# Patient Record
Sex: Female | Born: 2000 | Race: Black or African American | Hispanic: No | Marital: Single | Smoking: Never smoker
Health system: Southern US, Community
[De-identification: ages and names within clinical notes are randomized; demographics above are authoritative.]

## PROBLEM LIST (undated history)

## (undated) DIAGNOSIS — J45909 Unspecified asthma, uncomplicated: Secondary | ICD-10-CM

---
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
## 2021-06-02 DIAGNOSIS — E538 Deficiency of other specified B group vitamins: Secondary | ICD-10-CM | POA: Insufficient documentation

## 2021-08-21 ENCOUNTER — Other Ambulatory Visit: Payer: Self-pay

## 2021-08-21 ENCOUNTER — Ambulatory Visit
Admission: RE | Admit: 2021-08-21 | Discharge: 2021-08-21 | Disposition: A | Discharge status: Home / Self Care | Payer: Medicaid Other | Source: Ambulatory Visit | Attending: Emergency Medicine | Admitting: Emergency Medicine

## 2021-08-21 VITALS — BP 119/83 | HR 87 | Temp 98.4°F | Resp 16 | Ht 68.0 in | Wt 165.0 lb

## 2021-08-21 DIAGNOSIS — Z202 Contact with and (suspected) exposure to infections with a predominantly sexual mode of transmission: Secondary | ICD-10-CM | POA: Diagnosis not present

## 2021-08-21 MED ORDER — DOXYCYCLINE HYCLATE 100 MG PO CAPS: 100.0000 mg | ORAL_CAPSULE | Freq: Two times a day (BID) | ORAL | 0 refills | Status: AC

## 2021-08-21 NOTE — ED Triage Notes (Signed)
Pt's boyfriend went to doctor yesterday and tested positive for chlamydia.   Pt was last tested in March and was negative. Pt's boyfriend hasn't been tested in years so they're not sure how long he's been positive.   Pt is having no symptoms.

## 2021-08-21 NOTE — ED Provider Notes (Signed)
[facility]   MRN: [id number] DOB: [birth year]  Subjective:   Chief Complaint;  Chief Complaint  Patient presents with   Exposure to STD    Entered by patient  Pt's boyfriend went to doctor yesterday and tested positive for chlamydia.   Pt was last tested in March and was negative. Pt's boyfriend hasn't been tested in years so they're not sure how long he's been positive.   Pt is having no symptoms  Leslie Fuentes is a 21 y.o. female presenting for 2 days ago.  She states her boyfriend had a positive diagnosis of chlamydia yesterday patient denies symptoms  No current facility-administered medications for this encounter.  Current Outpatient Medications:    doxycycline (VIBRAMYCIN) 100 MG capsule, Take 1 capsule (100 mg total) by mouth 2 (two) times daily for 7 days., Disp: 14 capsule, Rfl: 0   ALBUTEROL IN, Inhale into the lungs as needed., Disp: , Rfl:    aspirin-acetaminophen-caffeine (EXCEDRIN MIGRAINE) 250-250-65 MG tablet, Take 1 tablet by mouth every 6 (six) hours as needed for headache., Disp: , Rfl:    metroNIDAZOLE (FLAGYL) 500 MG tablet, Take 1 tablet (500 mg total) by mouth 2 (two) times daily., Disp: 14 tablet, Rfl: 0   ondansetron (ZOFRAN ODT) 4 MG disintegrating tablet, Take 1 tablet (4 mg total) by mouth every 8 (eight) hours as needed., Disp: 20 tablet, Rfl: 0   No Known Allergies  Past Medical History:  Diagnosis Date   Asthma      Review of Systems  All other systems reviewed and are negative.    Objective:   Vitals: BP 119/83 (BP Location: Left Arm)   Pulse 87   Temp 98.4 F (36.9 C) (Oral)   Resp 16   Ht 5\' 8"  (1.727 m)   Wt 165 lb (74.8 kg)   LMP 08/07/2021 (Approximate)   SpO2 99%   Breastfeeding Unknown   BMI 25.09 kg/m   Physical Exam Vitals and nursing note reviewed.  Constitutional:      General: She is not in acute distress.    Appearance: She is well-developed.  HENT:     Head: Normocephalic and atraumatic.   Eyes:     Conjunctiva/sclera: Conjunctivae normal.  Cardiovascular:     Rate and Rhythm: Normal rate and regular rhythm.  Pulmonary:     Effort: Pulmonary effort is normal. No respiratory distress.     Breath sounds: Normal breath sounds.  Musculoskeletal:     Cervical back: Neck supple.  Skin:    General: Skin is warm and dry.     Capillary Refill: Capillary refill takes less than 2 seconds.  Neurological:     Mental Status: She is alert.  Psychiatric:        Mood and Affect: Mood normal.     No results found for this or any previous visit (from the past 24 hour(s)).  No results found.     Assessment and Plan :   1. Exposure to chlamydia     Meds ordered this encounter  Medications   doxycycline (VIBRAMYCIN) 100 MG capsule    Sig: Take 1 capsule (100 mg total) by mouth 2 (two) times daily for 7 days.    Dispense:  14 capsule    Refill:  0    MDM:  Leslie Fuentes is a 20 y.o. female presenting for evaluation for chlamydia.  Patient was exposed to her boyfriend who tested positive yesterday for chlamydia.  Her last unprotected  sex was 2 days ago she is asymptomatic.  Urine was sent for chlamydia gonorrhea and trichomoniasis.  Patient will be notified for the results she will return for new or worsening symptoms.  Doxycycline was prescribed which patient could take started or wait until results returned.  I discussed treatment, follow up and return instructions. Questions were answered. Patient/representative stated understanding of instructions and patient is stable for discharge.  Leslie Conger FNP-C MCN    Leslie Baseman, NP 08/21/21 [phone]

## 2021-08-21 NOTE — Discharge Instructions (Addendum)
Take doxycycline now or wait until your results return.  Abstain from intercourse until your tests are known protective sex will help prevention from noncurable diseases such as herpes, warts and HIV

## 2021-08-23 LAB — CERVICOVAGINAL ANCILLARY ONLY
Bacterial Vaginitis (gardnerella): POSITIVE — AB
Candida Glabrata: NEGATIVE
Candida Vaginitis: NEGATIVE
Chlamydia: POSITIVE — AB
Comment: NEGATIVE
Comment: NEGATIVE
Comment: NEGATIVE
Comment: NEGATIVE
Comment: NEGATIVE
Comment: NORMAL
Neisseria Gonorrhea: NEGATIVE
Trichomonas: NEGATIVE

## 2021-08-24 ENCOUNTER — Telehealth (HOSPITAL_COMMUNITY): Payer: Self-pay | Admitting: Emergency Medicine

## 2021-08-24 MED ORDER — METRONIDAZOLE 0.75 % VA GEL: 1.0000 | Freq: Every day | VAGINAL | 0 refills | Status: AC

## 2022-04-26 IMAGING — US US OB < 14 WEEKS - US OB TV
1 series · 13 of 28 positions shown · non-contrast
Comparison: None.

CLINICAL DATA: Vaginal bleeding for 1 day. Quantitative beta HCG is
[DATE]. Estimated gestational age by LMP is 7 weeks 3 days.

EXAM:
OBSTETRIC <14 WK US AND TRANSVAGINAL OB US
TECHNIQUE: Both transabdominal and transvaginal ultrasound examinations were
performed for complete evaluation of the gestation as well as the
maternal uterus, adnexal regions, and pelvic cul-de-sac.
Transvaginal technique was performed to assess early pregnancy.

[Series 1: us ob less than 14 weeks with ob transvaginal · 13 of 99 slices shown]
[im 4/99]
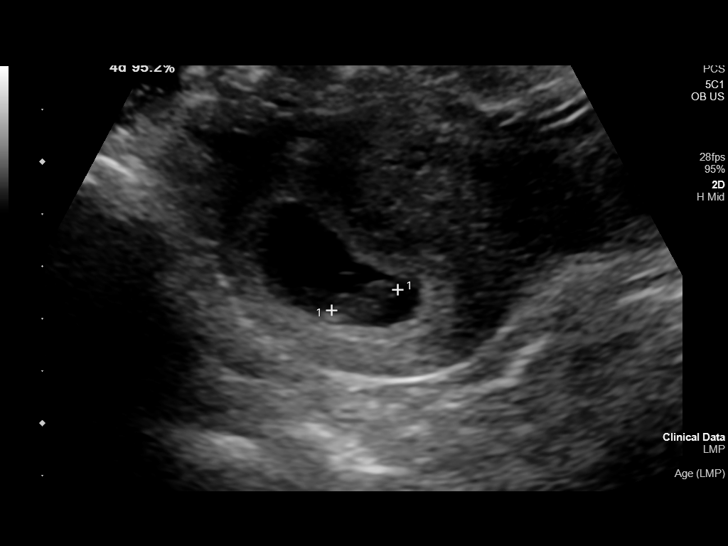
[im 11/99]
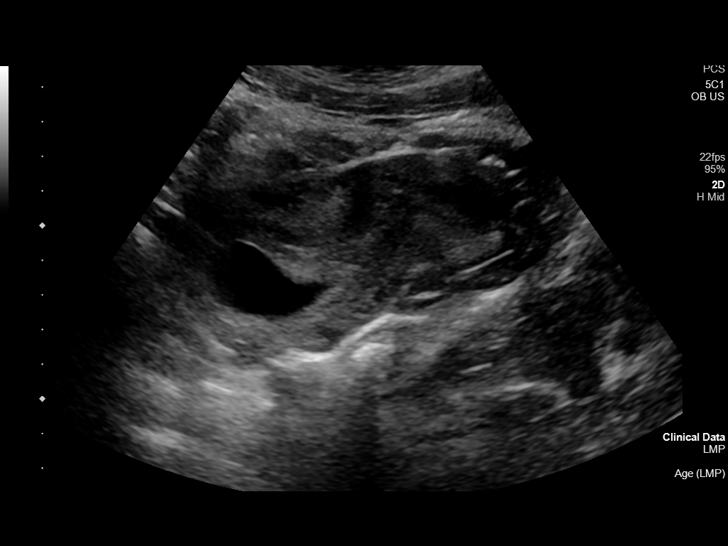
[im 19/99]
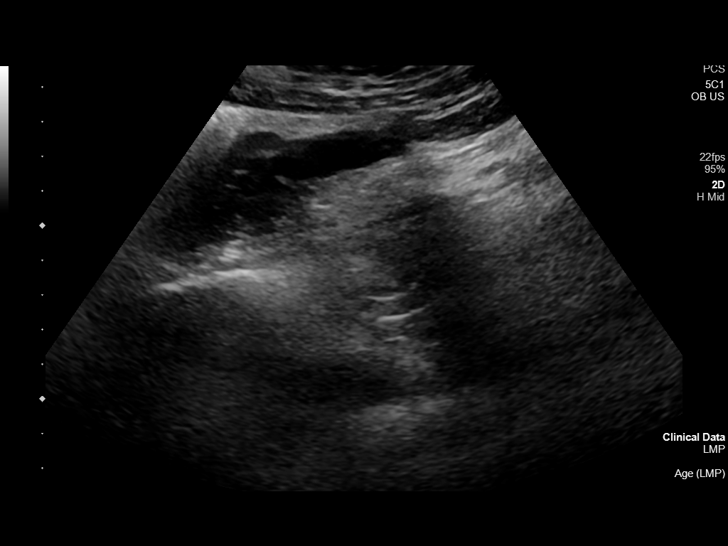
[im 26/99]
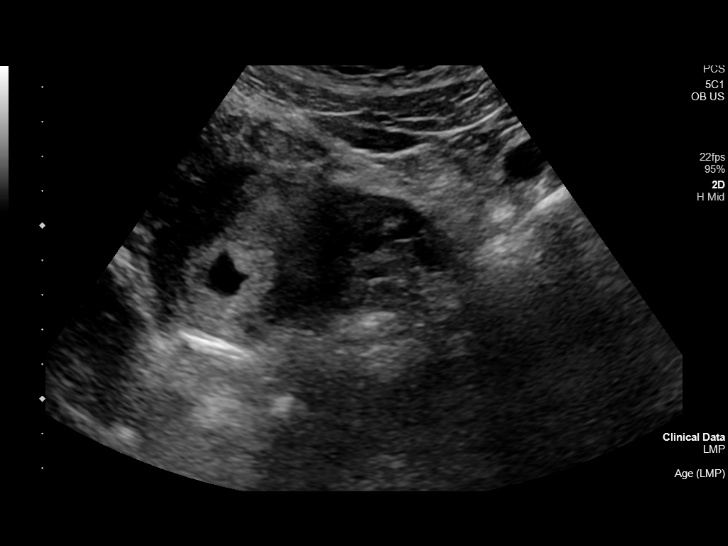
[im 33/99]
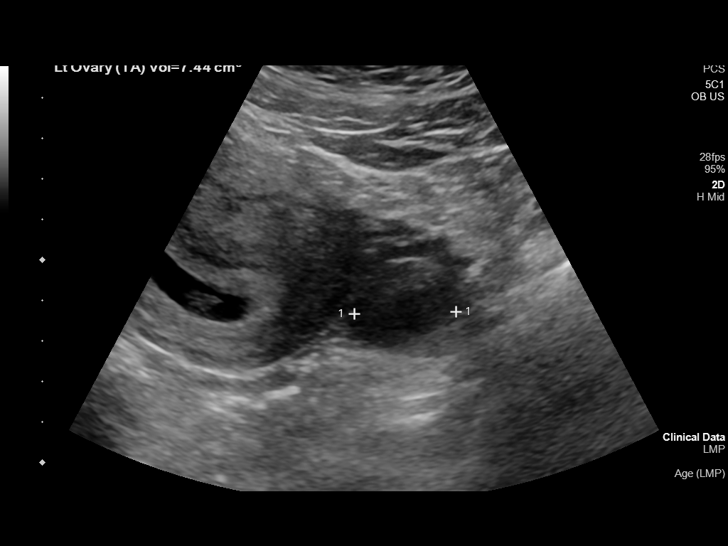
[im 40/99]
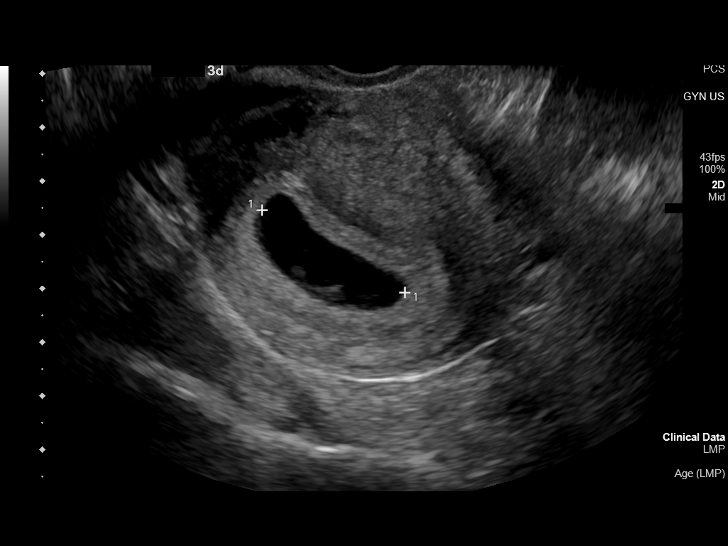
[im 51/99]
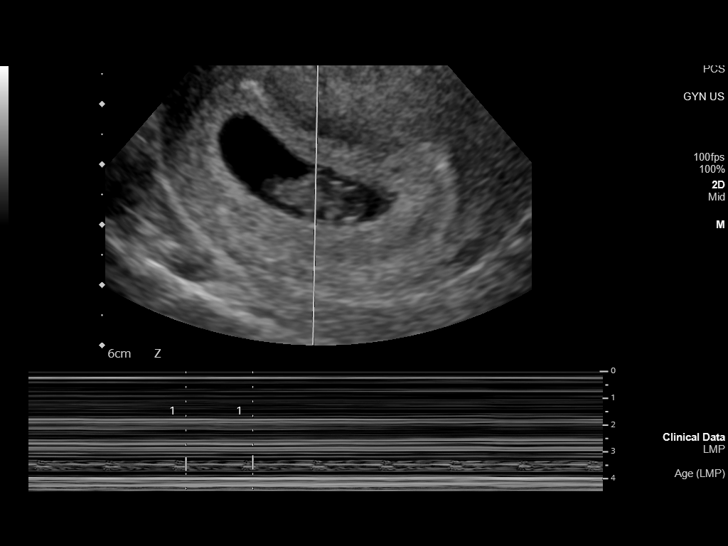
[im 59/99]
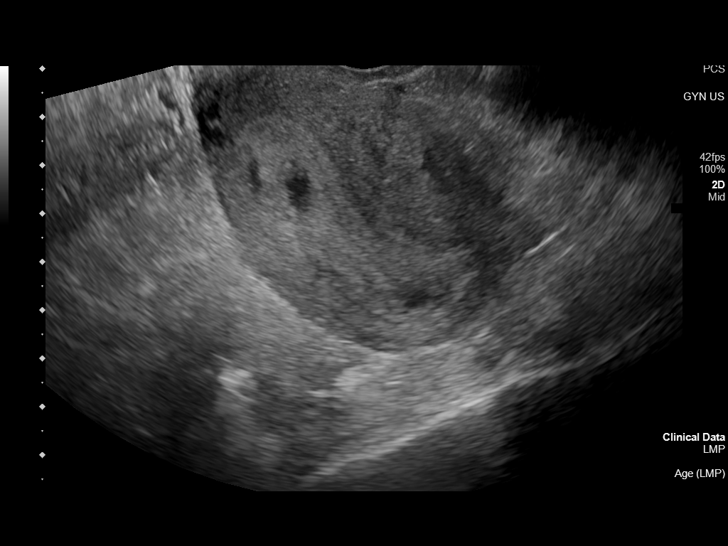
[im 66/99]
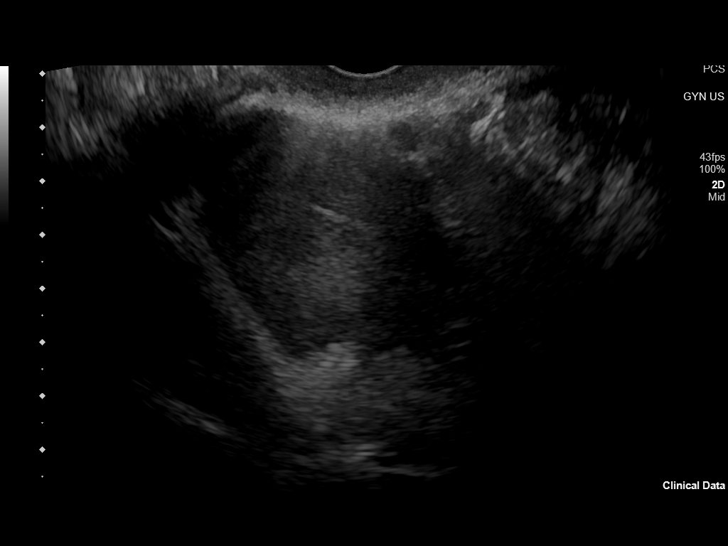
[im 73/99]
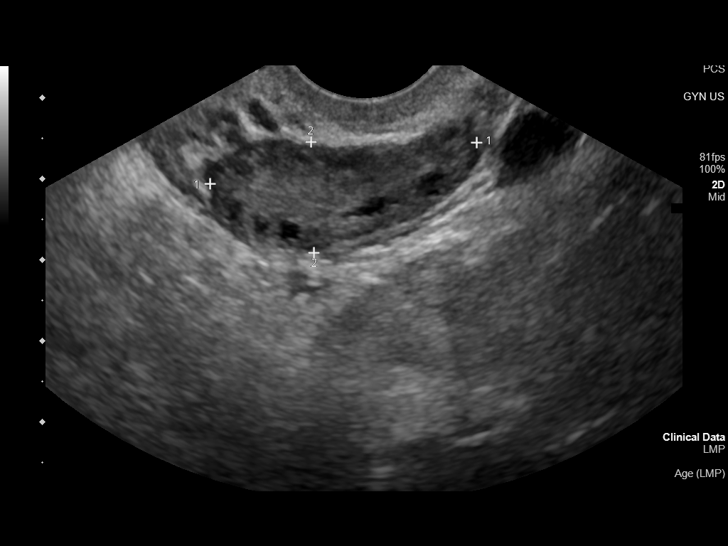
[im 80/99]
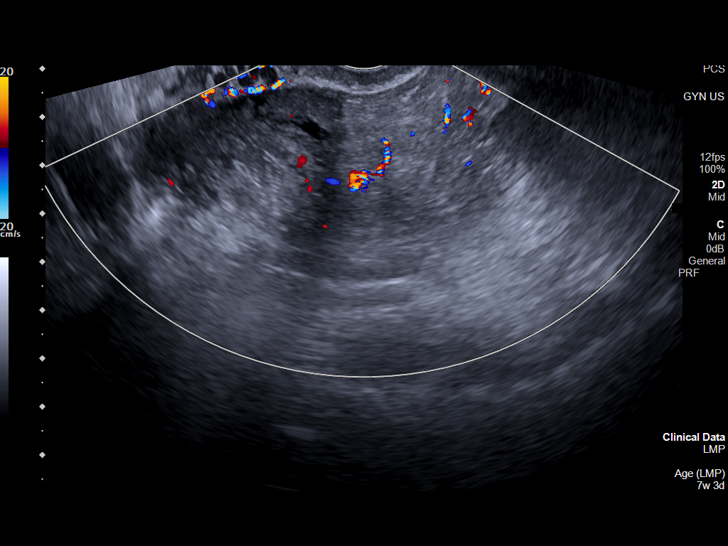
[im 88/99]
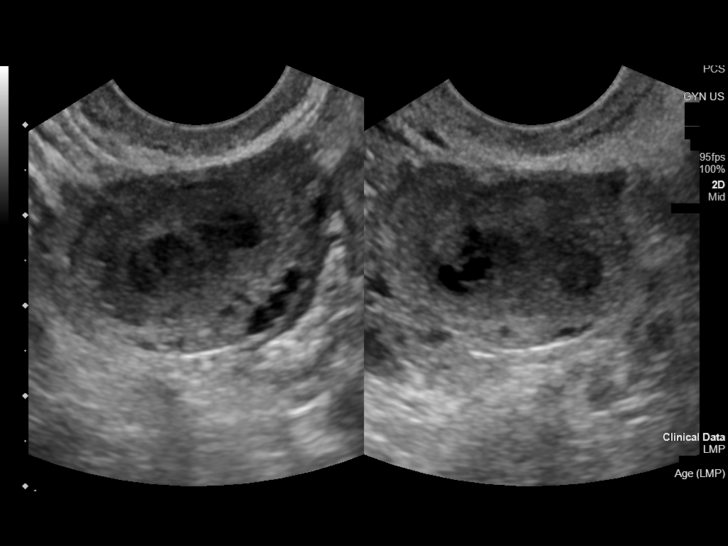
[im 95/99]
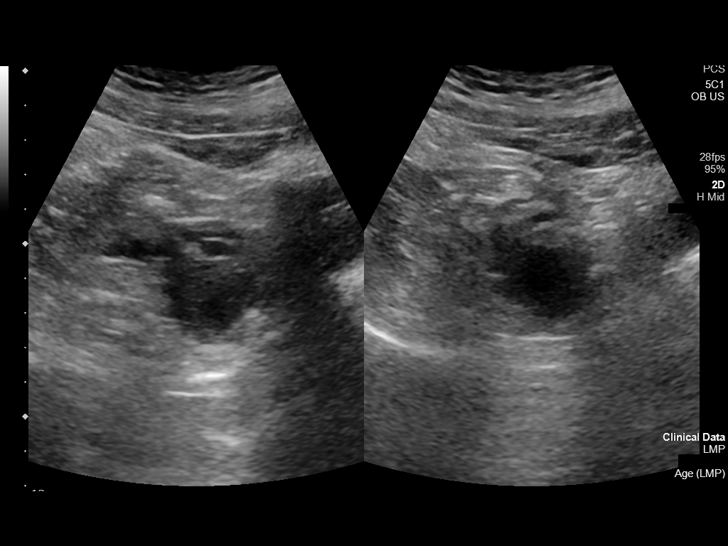

[13 of 28 positions shown; findings below may reference images not displayed]

FINDINGS: Intrauterine gestational sac: A single intrauterine gestational sac
is present.

Yolk sac:  Yolk sac is identified.

Embryo:  Fetal pole is present.

Cardiac Activity: Fetal cardiac activity is observed.

Heart Rate: 153 bpm

CRL:  14 mm   7 w   5 d                  US EDC: 08/04/2020

Subchorionic hemorrhage:  None visualized.

Maternal uterus/adnexae: The uterus appears retroverted. No
myometrial mass lesions identified. Both ovaries are visualized and
appear normal. Probable corpus luteal cyst on the left. Small amount
of free fluid in the pelvis.
IMPRESSION: Single intrauterine pregnancy. Estimated gestational age by LMP is 7
weeks 5 days. No acute complication is identified sonographically.

## 2022-07-05 DIAGNOSIS — I471 Supraventricular tachycardia, unspecified: Secondary | ICD-10-CM | POA: Insufficient documentation

## 2022-11-07 DIAGNOSIS — F4312 Post-traumatic stress disorder, chronic: Secondary | ICD-10-CM | POA: Insufficient documentation

## 2022-11-07 DIAGNOSIS — F4323 Adjustment disorder with mixed anxiety and depressed mood: Secondary | ICD-10-CM | POA: Insufficient documentation

## 2022-11-13 ENCOUNTER — Encounter: Payer: Self-pay | Admitting: Emergency Medicine

## 2022-11-13 ENCOUNTER — Ambulatory Visit
Admission: EM | Admit: 2022-11-13 | Discharge: 2022-11-13 | Disposition: A | Discharge status: Home / Self Care | Payer: Medicaid Other | Attending: Physician Assistant | Admitting: Physician Assistant

## 2022-11-13 DIAGNOSIS — R509 Fever, unspecified: Secondary | ICD-10-CM | POA: Diagnosis present

## 2022-11-13 DIAGNOSIS — R590 Localized enlarged lymph nodes: Secondary | ICD-10-CM | POA: Insufficient documentation

## 2022-11-13 DIAGNOSIS — Z1152 Encounter for screening for COVID-19: Secondary | ICD-10-CM | POA: Insufficient documentation

## 2022-11-13 LAB — SARS CORONAVIRUS 2 BY RT PCR: SARS Coronavirus 2 by RT PCR: NEGATIVE

## 2022-11-13 LAB — POCT RAPID STREP A (OFFICE): Rapid Strep A Screen: NEGATIVE

## 2022-11-13 NOTE — ED Provider Notes (Signed)
[name]    CSN: [id number] Arrival date & time: 11/13/22  1014      History   Chief Complaint Chief Complaint  Patient presents with   Lymphadenopathy   Chills   Headache    HPI Leslie Fuentes is a 22 y.o. female.   Patient reports subjective fever at home with bodyaches and chills.  She has been taking Tylenol with reduction of fever.  She had Tylenol before she came here today.  She also complains of swollen lymph node to left side of her neck.  Area is nontender.  She denies sore throat, congestion, cough.  She denies sick contacts.  Patient missed work today due to symptoms.    Past Medical History:  Diagnosis Date   Asthma     There are no problems to display for this patient.   Past Surgical History:  Procedure Laterality Date   TONSILLECTOMY AND ADENOIDECTOMY N/A 09/21/2017   Procedure: TONSILLECTOMY AND ADENOIDECTOMY;  Surgeon: Vernie Murders, MD;  Location: [facility];  Service: ENT;  Laterality: N/A;    OB History     Gravida  1   Para      Term      Preterm      AB      Living         SAB      IAB      Ectopic      Multiple      Live Births               Home Medications    Prior to Admission medications   Medication Sig Start Date End Date Taking? Authorizing Provider  FLUoxetine (PROZAC) 10 MG capsule Take 10 mg by mouth daily. 11/07/22 11/07/23 Yes [provider]  medroxyPROGESTERone (DEPO-PROVERA) 150 MG/ML injection Inject 150 mg into the muscle every 3 (three) months. 04/05/22  Yes [provider]  prazosin (MINIPRESS) 1 MG capsule Take 1 mg by mouth at bedtime. 11/07/22 11/07/23 Yes [provider]  ALBUTEROL IN Inhale into the lungs as needed.    [provider]  aspirin-acetaminophen-caffeine (EXCEDRIN MIGRAINE) [phone] MG tablet Take 1 tablet by mouth every 6 (six) hours as needed for headache.    [provider]  ondansetron (ZOFRAN ODT) 4 MG  disintegrating tablet Take 1 tablet (4 mg total) by mouth every 8 (eight) hours as needed. 12/21/20   Nita Sickle, MD    Family History Family History  Problem Relation Age of Onset   Healthy Mother    Healthy Father     Social History Social History   Tobacco Use   Smoking status: Never   Smokeless tobacco: Never  Vaping Use   Vaping status: Never Used  Substance Use Topics   Alcohol use: Not Currently   Drug use: Not Currently     Allergies   Patient has no known allergies.   Review of Systems Review of Systems  Constitutional:  Positive for chills and fever.  HENT:  Negative for ear pain and sore throat.   Eyes:  Negative for pain and visual disturbance.  Respiratory:  Negative for cough and shortness of breath.   Cardiovascular:  Negative for chest pain and palpitations.  Gastrointestinal:  Negative for abdominal pain and vomiting.  Genitourinary:  Negative for dysuria and hematuria.  Musculoskeletal:  Negative for arthralgias and back pain.  Skin:  Negative for color change and rash.  Neurological:  Negative for seizures and syncope.  All other systems reviewed and are negative.    Physical Exam Triage Vital Signs ED Triage Vitals [11/13/22 1033]  Encounter Vitals Group     BP (!) 134/93     Systolic BP Percentile      Diastolic BP Percentile      Pulse Rate (!) 101     Resp 17     Temp 98.5 F (36.9 C)     Temp Source Oral     SpO2 97 %     Weight      Height      Head Circumference      Peak Flow      Pain Score 0     Pain Loc      Pain Education      Exclude from Growth Chart    No data found.  Updated Vital Signs BP (!) 134/93 (BP Location: Right Arm)   Pulse (!) 101   Temp 98.5 F (36.9 C) (Oral)   Resp 17   SpO2 97%   Visual Acuity Right Eye Distance:   Left Eye Distance:   Bilateral Distance:    Right Eye Near:   Left Eye Near:    Bilateral Near:     Physical Exam Vitals and nursing note reviewed.   Constitutional:      General: She is not in acute distress.    Appearance: She is well-developed.  HENT:     Head: Normocephalic and atraumatic.  Eyes:     Conjunctiva/sclera: Conjunctivae normal.  Cardiovascular:     Rate and Rhythm: Normal rate and regular rhythm.     Heart sounds: No murmur heard. Pulmonary:     Effort: Pulmonary effort is normal. No respiratory distress.     Breath sounds: Normal breath sounds.  Abdominal:     Palpations: Abdomen is soft.     Tenderness: There is no abdominal tenderness.  Musculoskeletal:        General: No swelling.     Cervical back: Neck supple.  Lymphadenopathy:     Cervical: Cervical adenopathy present.  Skin:    General: Skin is warm and dry.     Capillary Refill: Capillary refill takes less than 2 seconds.  Neurological:     Mental Status: She is alert.  Psychiatric:        Mood and Affect: Mood normal.      UC Treatments / Results  Labs (all labs ordered are listed, but only abnormal results are displayed) Labs Reviewed  SARS CORONAVIRUS 2 BY RT PCR  POCT RAPID STREP A (OFFICE)    EKG   Radiology No results found.  Procedures Procedures (including critical care time)  Medications Ordered in UC Medications - No data to display  Initial Impression / Assessment and Plan / UC Course  I have reviewed the triage vital signs and the nursing notes.  Pertinent labs & imaging results that were available during my care of the patient were reviewed by me and considered in my medical decision making (see chart for details).     Patient overall well-appearing in no acute distress.  Rapid strep negative in clinic today, HEENT exam normal.  Left cervical adenopathy, no TTP.  Work note given today.  Supportive care discussed.  Return precautions discussed.  COVID test pending. Final Clinical Impressions(s) / UC Diagnoses   Final diagnoses:  Fever, unspecified     Discharge Instructions      Continue with Tylenol or  Ibuprofen as needed for fever  Rest, drink plenty of fluids May return to work or school as long as fever free for 24 hours   ED Prescriptions   None    PDMP not reviewed this encounter.   Ward, Tylene Fantasia, PA-C 11/13/22 1132

## 2022-11-13 NOTE — Discharge Instructions (Signed)
Continue with Tylenol or Ibuprofen as needed for fever Rest, drink plenty of fluids May return to work or school as long as fever free for 24 hours

## 2022-11-13 NOTE — ED Triage Notes (Signed)
Pt reports lymph node swelling and pain on the left side of neck, chills and sweating. Unknown if had a fever. Has been taking Tylenol.

## 2023-05-27 ENCOUNTER — Ambulatory Visit: Admission: EM | Admit: 2023-05-27 | Discharge: 2023-05-27 | Disposition: A | Discharge status: Home / Self Care

## 2023-05-27 ENCOUNTER — Ambulatory Visit
Admission: EM | Admit: 2023-05-27 | Discharge: 2023-05-27 | Disposition: A | Discharge status: Home / Self Care | Attending: Emergency Medicine | Admitting: Emergency Medicine

## 2023-05-27 DIAGNOSIS — S61452A Open bite of left hand, initial encounter: Secondary | ICD-10-CM | POA: Diagnosis not present

## 2023-05-27 DIAGNOSIS — W540XXA Bitten by dog, initial encounter: Secondary | ICD-10-CM | POA: Diagnosis not present

## 2023-05-27 DIAGNOSIS — S61432A Puncture wound without foreign body of left hand, initial encounter: Secondary | ICD-10-CM

## 2023-05-27 DIAGNOSIS — L709 Acne, unspecified: Secondary | ICD-10-CM | POA: Insufficient documentation

## 2023-05-27 MED ORDER — AMOXICILLIN-POT CLAVULANATE 875-125 MG PO TABS: 1.0000 | ORAL_TABLET | Freq: Two times a day (BID) | ORAL | 0 refills | Status: AC

## 2023-05-27 NOTE — ED Triage Notes (Signed)
 Patient presents to UC for dog bite to left hand yesterday. States she cleaned site with iodine and wrapped with guaze. Up to date on Tdap.

## 2023-05-27 NOTE — Discharge Instructions (Addendum)
 Today you are evaluated for dog bite  As the dog's your pet will not need rabies series  As you are up-to-date on your vaccines you will not need tetanus injection today  Wound has been cleansed here in the office, cleanse once daily with unscented soap and water, pat dry, may leave open to air but please cover with a nonstick Band-Aid if at risk for becoming dirty  Dog mouths are considered to be dirty and therefore you have been started on antibiotics, begin Augmentin every morning and every evening for 7 days  May take Tylenol and/or Motrin as needed for pain  Please follow-up for any concerns regarding healing

## 2023-05-27 NOTE — ED Notes (Signed)
 Attempted to call patient x 1, phone number is not working.  Patient Access knew where patient was parked, patient is no longer located in the parking.  Will d/c.

## 2023-05-28 NOTE — ED Provider Notes (Signed)
 [name]    CSN: [id number] Arrival date & time: 05/27/23  1508      History   Chief Complaint Chief Complaint  Patient presents with   Animal Bite    HPI Leslie Fuentes is a 23 y.o. female.   Patient presents for evaluation of a dog bite to the left hand beginning 1 day ago.  Offending animal is her pet who is up-to-date on vaccines.  Per patient up-to-date on tetanus.  Has cleans but has had pain to the site all day.  Able to complete range of motion but painful to lift objects.  Denies numbness or tingling.  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 11/07/2022   Situational mixed anxiety and depressive disorder 11/07/2022   PSVT (paroxysmal supraventricular tachycardia) (HCC) 07/05/2022   Vitamin B12 deficiency 06/02/2021   Menorrhagia with regular cycle 02/12/2018   Allergic rhinitis 08/15/2017   Eczema 01/28/2014   Mild intermittent asthma 01/28/2014    Past Surgical History:  Procedure Laterality Date   TONSILLECTOMY AND ADENOIDECTOMY N/A 09/21/2017   Procedure: TONSILLECTOMY AND ADENOIDECTOMY;  Surgeon: Vernie Murders, MD;  Location: [facility];  Service: ENT;  Laterality: N/A;    OB History     Gravida  1   Para      Term      Preterm      AB      Living         SAB      IAB      Ectopic      Multiple      Live Births               Home Medications    Prior to Admission medications   Medication Sig Start Date End Date Taking? Authorizing Provider  amoxicillin-clavulanate (AUGMENTIN) 875-125 MG tablet Take 1 tablet by mouth every 12 (twelve) hours. 05/27/23  Yes Munachimso Palin, Elita Boone, NP  ALBUTEROL IN Inhale into the lungs as needed.    [provider]  aspirin-acetaminophen-caffeine (EXCEDRIN MIGRAINE) [phone] MG tablet Take 1 tablet by mouth every 6  (six) hours as needed for headache.    [provider]  FLUoxetine (PROZAC) 10 MG capsule Take 10 mg by mouth daily. 11/07/22 11/07/23  [provider]  medroxyPROGESTERone (DEPO-PROVERA) 150 MG/ML injection Inject 150 mg into the muscle every 3 (three) months. 04/05/22   [provider]  ondansetron (ZOFRAN ODT) 4 MG disintegrating tablet Take 1 tablet (4 mg total) by mouth every 8 (eight) hours as needed. 12/21/20   Nita Sickle, MD  prazosin (MINIPRESS) 1 MG capsule Take 1 mg by mouth at bedtime. 11/07/22 11/07/23  [provider]    Family History Family History  Problem Relation Age of Onset   Healthy Mother    Healthy Father     Social History Social History   Tobacco Use   Smoking status: Never   Smokeless tobacco: Never  Vaping Use   Vaping status: Never Used  Substance Use Topics   Alcohol use: Not Currently   Drug use: Not Currently     Allergies   Patient has no known allergies.   Review of Systems Review of Systems   Physical Exam Triage Vital Signs ED Triage Vitals  Encounter Vitals Group     BP 05/27/23 1546  125/89     Systolic BP Percentile --      Diastolic BP Percentile --      Pulse Rate 05/27/23 1546 76     Resp 05/27/23 1546 18     Temp 05/27/23 1546 97.6 F (36.4 C)     Temp Source 05/27/23 1546 Temporal     SpO2 05/27/23 1546 100 %     Weight --      Height --      Head Circumference --      Peak Flow --      Pain Score 05/27/23 1545 8     Pain Loc --      Pain Education --      Exclude from Growth Chart --    No data found.  Updated Vital Signs BP 125/89 (BP Location: Left Arm)   Pulse 76   Temp 97.6 F (36.4 C) (Temporal)   Resp 18   SpO2 100%   Visual Acuity Right Eye Distance:   Left Eye Distance:   Bilateral Distance:    Right Eye Near:   Left Eye Near:    Bilateral Near:     Physical Exam Constitutional:      Appearance: Normal appearance.  Eyes:     Extraocular Movements:  Extraocular movements intact.  Pulmonary:     Effort: Pulmonary effort is normal.  Skin:    Comments: 0.5 cm puncture wound present to the palmar aspect of the left hand, mild swelling surrounding, no erythema or drainage noted, able to complete range of motion, 2+ radial pulse  Neurological:     Mental Status: She is alert and oriented to person, place, and time. Mental status is at baseline.      UC Treatments / Results  Labs (all labs ordered are listed, but only abnormal results are displayed) Labs Reviewed - No data to display  EKG   Radiology No results found.  Procedures Procedures (including critical care time)  Medications Ordered in UC Medications - No data to display  Initial Impression / Assessment and Plan / UC Course  I have reviewed the triage vital signs and the nursing notes.  Pertinent labs & imaging results that were available during my care of the patient were reviewed by me and considered in my medical decision making (see chart for details).  Puncture wound of left hand without foreign body, dog bite of left hand  Wound cleansed with chlorhexidine and covered with a nonadherent dressing, prescribed Augmentin, up-to-date on tetanus therefore deferring up-to-date on rabies series for animal therefore deferring, recommended over-the-counter analgesics with follow-up as needed Final Clinical Impressions(s) / UC Diagnoses   Final diagnoses:  Dog bite of left hand, initial encounter     Discharge Instructions      Today you are evaluated for dog bite  As the dog's your pet will not need rabies series  As you are up-to-date on your vaccines you will not need tetanus injection today  Wound has been cleansed here in the office, cleanse once daily with unscented soap and water, pat dry, may leave open to air but please cover with a nonstick Band-Aid if at risk for becoming dirty  Dog mouths are considered to be dirty and therefore you have been  started on antibiotics, begin Augmentin every morning and every evening for 7 days  May take Tylenol and/or Motrin as needed for pain  Please follow-up for any concerns regarding healing   ED Prescriptions  Medication Sig Dispense Auth. Provider   amoxicillin-clavulanate (AUGMENTIN) 875-125 MG tablet Take 1 tablet by mouth every 12 (twelve) hours. 14 tablet Garima Chronis, Elita Boone, NP      PDMP not reviewed this encounter.   Valinda Hoar, [place] 05/28/23 [phone]

## 2023-12-19 ENCOUNTER — Ambulatory Visit

## 2024-01-07 ENCOUNTER — Ambulatory Visit
Admission: EM | Admit: 2024-01-07 | Discharge: 2024-01-07 | Disposition: A | Discharge status: Home / Self Care | Attending: Emergency Medicine | Admitting: Emergency Medicine

## 2024-01-07 DIAGNOSIS — R300 Dysuria: Secondary | ICD-10-CM | POA: Diagnosis present

## 2024-01-07 DIAGNOSIS — N898 Other specified noninflammatory disorders of vagina: Secondary | ICD-10-CM

## 2024-01-07 LAB — POCT URINE DIPSTICK
Glucose, UA: NEGATIVE mg/dL
Leukocytes, UA: NEGATIVE
Nitrite, UA: NEGATIVE
Protein Ur, POC: 30 mg/dL — AB
Spec Grav, UA: >=1.03 — AB (ref 1.010–1.025)
pH, UA: 5.5 (ref 5.0–8.0)

## 2024-01-07 MED ORDER — FLUCONAZOLE 150 MG PO TABS
150.0000 mg | ORAL_TABLET | ORAL | 0 refills | Status: AC | PRN
Start: 2024-01-07 — End: ?

## 2024-01-07 NOTE — Discharge Instructions (Addendum)
 Today you are being treated prophylactically for yeast.   Take diflucan 150 mg once, if symptoms still present in 3 days then you may take second pill   Urinalysis is negative for bladder infection, sample has been sent to the lab to determine if bacteria is truly present, if this occurs you will be notified and additional medicine sent to the  Yeast infections which are caused by a naturally occurring fungus called candida. Vaginosis is an inflammation of the vagina that can result in discharge, itching and pain. The cause is usually a change in the normal balance of vaginal bacteria or an infection. Vaginosis can also result from reduced estrogen levels after menopause.  Labs pending 2-3 days, you will be contacted if positive for any sti and treatment will be sent to the pharmacy, you will have to return to the clinic if positive for gonorrhea to receive treatment   Please refrain from having sex until labs results, if positive please refrain from having sex until treatment complete and symptoms resolve   If positive for Chlamydia  gonorrhea or trichomoniasis please notify partner or partners so they may tested as well  , it is recommended you use some form of protection against the transmission of sti infections  such as condoms or dental dams with each sexual encounter    In addition:   Avoid baths, hot tubs and whirlpool spas.  Don't use scented or harsh soaps, such as those with deodorant or antibacterial action. Avoid irritants. These include scented tampons and pads. Wipe from front to back after using the toilet.  Don't douche. Your vagina doesn't require cleansing other than normal bathing.  Use a  condom. Wear cotton underwear, this fabric helps absorb moisture

## 2024-01-07 NOTE — ED Provider Notes (Signed)
 CAY RALPH PELT    CSN: [id number] Arrival date & time: 01/07/24  1512      History   Chief Complaint Chief Complaint  Patient presents with   Vaginal Discharge   Dysuria    HPI Leslie Fuentes is a 23 y.o. female.   Patient presents for evaluation of Cohen Boettner thick vaginal discharge, dysuria, urinary frequency and lower abdominal pain described as cramping beginning 2 days ago.  Symptoms started after completion of menstruation.  Receiving Depo-Provera injections no concern for pregnancy.  Denies flank pain, fever, hematuria, vaginal itching or odor.  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 11/07/2022   Situational mixed anxiety and depressive disorder 11/07/2022   PSVT (paroxysmal supraventricular tachycardia) 07/05/2022   Vitamin B12 deficiency 06/02/2021   Menorrhagia with regular cycle 02/12/2018   Allergic rhinitis 08/15/2017   Eczema 01/28/2014   Mild intermittent asthma 01/28/2014    Past Surgical History:  Procedure Laterality Date   TONSILLECTOMY AND ADENOIDECTOMY N/A 09/21/2017   Procedure: TONSILLECTOMY AND ADENOIDECTOMY;  Surgeon: Juengel, Paul, MD;  Location: [facility];  Service: ENT;  Laterality: N/A;    OB History     Gravida  1   Para      Term      Preterm      AB      Living         SAB      IAB      Ectopic      Multiple      Live Births               Home Medications    Prior to Admission medications   Medication Sig Start Date End Date Taking? Authorizing Provider  fluconazole (DIFLUCAN) 150 MG tablet Take 1 tablet (150 mg total) by mouth every three (3) days as needed for up to 2 doses. 01/07/24  Yes Teniola Tseng R, NP  ALBUTEROL IN Inhale into the lungs as needed.    [provider]  amoxicillin -clavulanate (AUGMENTIN ) 875-125 MG tablet  Take 1 tablet by mouth every 12 (twelve) hours. 05/27/23   Teresa Shelba SAUNDERS, NP  aspirin-acetaminophen -caffeine (EXCEDRIN MIGRAINE) 250-250-65 MG tablet Take 1 tablet by mouth every 6 (six) hours as needed for headache.    [provider]  FLUoxetine (PROZAC) 10 MG capsule Take 10 mg by mouth daily. 11/07/22 11/07/23  [provider]  medroxyPROGESTERone (DEPO-PROVERA) 150 MG/ML injection Inject 150 mg into the muscle every 3 (three) months. 04/05/22   [provider]  ondansetron  (ZOFRAN  ODT) 4 MG disintegrating tablet Take 1 tablet (4 mg total) by mouth every 8 (eight) hours as needed. 12/21/20   Edelmiro Leash, MD  prazosin (MINIPRESS) 1 MG capsule Take 1 mg by mouth at bedtime. 11/07/22 11/07/23  [provider]    Family History Family History  Problem Relation Age of Onset   Healthy Mother    Healthy Father     Social History Social History   Tobacco Use   Smoking status: Never   Smokeless tobacco: Never  Vaping Use   Vaping status: Never Used  Substance Use Topics   Alcohol use: Not Currently   Drug use: Not Currently     Allergies   Patient has no known allergies.   Review of Systems Review of Systems  Genitourinary:  Positive for dysuria and vaginal discharge.     Physical Exam Triage Vital Signs ED Triage Vitals  Encounter Vitals Group     BP 01/07/24 1543 121/84     Girls Systolic BP Percentile --      Girls Diastolic BP Percentile --      Boys Systolic BP Percentile --      Boys Diastolic BP Percentile --      Pulse Rate 01/07/24 1543 84     Resp 01/07/24 1543 18     Temp 01/07/24 1543 98.2 F (36.8 C)     Temp Source 01/07/24 1543 Oral     SpO2 01/07/24 1543 98 %     Weight --      Height --      Head Circumference --      Peak Flow --      Pain Score 01/07/24 1540 6     Pain Loc --      Pain Education --      Exclude from Growth Chart --    No data found.  Updated Vital Signs BP 121/84 (BP Location:  Left Arm)   Pulse 84   Temp 98.2 F (36.8 C) (Oral)   Resp 18   LMP 01/04/2024 (Exact Date)   SpO2 98%   Breastfeeding No   Visual Acuity Right Eye Distance:   Left Eye Distance:   Bilateral Distance:    Right Eye Near:   Left Eye Near:    Bilateral Near:     Physical Exam Constitutional:      Appearance: Normal appearance.  Eyes:     Extraocular Movements: Extraocular movements intact.  Pulmonary:     Effort: Pulmonary effort is normal.  Abdominal:     Tenderness: There is no abdominal tenderness. There is no right CVA tenderness, left CVA tenderness or guarding.  Genitourinary:    Comments: deferred Neurological:     Mental Status: She is alert and oriented to person, place, and time.      UC Treatments / Results  Labs (all labs ordered are listed, but only abnormal results are displayed) Labs Reviewed  POCT URINE DIPSTICK - Abnormal; Notable for the following components:      Result Value   Bilirubin, UA small (*)    Ketones, POC UA trace (5) (*)    Spec Grav, UA >=1.030 (*)    Blood, UA moderate (*)    Protein Ur, POC =30 (*)    All other components within normal limits  URINE CULTURE  CERVICOVAGINAL ANCILLARY ONLY    EKG   Radiology No results found.  Procedures Procedures (including critical care time)  Medications Ordered in UC Medications - No data to display  Initial Impression / Assessment and Plan / UC Course  I have reviewed the triage vital signs and the nursing notes.  Pertinent labs & imaging results that were available during my care of the patient were reviewed by me and considered in my medical decision making (see chart for details).  Vaginal discharge, dysuria  Treating empirically for yeast, Diflucan sent and discussed administration, urinalysis negative, sent for culture will initiate antibiotics based on results, discussed all findings with patient, declined HIV and syphilis testing.STI labs pending will treat per protocol,  advised abstinence until lab results, and/or treatment is complete, advised condom use during all sexual encounters moving, may follow-up with urgent care as needed  Final Clinical Impressions(s) / UC Diagnoses   Final diagnoses:  Dysuria  Vaginal discharge     Discharge Instructions      Today you are being treated prophylactically for yeast.   Take diflucan 150 mg once, if symptoms still present in 3 days then you may take second pill   Urinalysis is negative for bladder infection, sample has been sent to the lab to determine if bacteria is truly present, if this occurs you will be notified and additional medicine sent to the  Yeast infections which are caused by a naturally occurring fungus called candida. Vaginosis is an inflammation of the vagina that can result in discharge, itching and pain. The cause is usually a change in the normal balance of vaginal bacteria or an infection. Vaginosis can also result from reduced estrogen levels after menopause.  Labs pending 2-3 days, you will be contacted if positive for any sti and treatment will be sent to the pharmacy, you will have to return to the clinic if positive for gonorrhea to receive treatment   Please refrain from having sex until labs results, if positive please refrain from having sex until treatment complete and symptoms resolve   If positive for Chlamydia  gonorrhea or trichomoniasis please notify partner or partners so they may tested as well  , it is recommended you use some form of protection against the transmission of sti infections  such as condoms or dental dams with each sexual encounter    In addition:   Avoid baths, hot tubs and whirlpool spas.  Don't use scented or harsh soaps, such as those with deodorant or antibacterial action. Avoid irritants. These include scented tampons and pads. Wipe from front to back after using the toilet.  Don't douche. Your vagina doesn't require cleansing other than normal  bathing.  Use a  condom. Wear cotton underwear, this fabric helps absorb moisture     ED Prescriptions     Medication Sig Dispense Auth. Provider   fluconazole (DIFLUCAN) 150 MG tablet Take 1 tablet (150 mg total) by mouth every three (3) days as needed for up to 2 doses. 2 tablet Briea Mcenery R, NP      PDMP not reviewed this encounter.   Teresa Shelba SAUNDERS, NP 01/07/24 [phone]

## 2024-01-07 NOTE — ED Triage Notes (Signed)
 Patient complains of pelvic pain and vaginal discharge that started 2 days ago. Patient also complains of painful ruination. Patient has not taking anything for symptoms. Rates pelvic pain 6/10.

## 2024-01-08 ENCOUNTER — Ambulatory Visit (HOSPITAL_COMMUNITY): Payer: Self-pay

## 2024-01-08 LAB — CERVICOVAGINAL ANCILLARY ONLY
Bacterial Vaginitis (gardnerella): POSITIVE — AB
Candida Glabrata: NEGATIVE
Candida Vaginitis: NEGATIVE
Chlamydia: NEGATIVE
Comment: NEGATIVE
Comment: NEGATIVE
Comment: NEGATIVE
Comment: NEGATIVE
Comment: NEGATIVE
Comment: NORMAL
Neisseria Gonorrhea: NEGATIVE
Trichomonas: NEGATIVE

## 2024-01-08 LAB — URINE CULTURE: Culture: 2000 — AB

## 2024-01-09 MED ORDER — METRONIDAZOLE 0.75 % VA GEL: 1.0000 | Freq: Every day | VAGINAL | 0 refills | Status: AC

## 2024-01-09 MED ORDER — METRONIDAZOLE 500 MG PO TABS: 500.0000 mg | ORAL_TABLET | Freq: Two times a day (BID) | ORAL | 0 refills | Status: DC
